# Patient Record
Sex: Female | Born: 1975 | Race: White | Hispanic: No | Marital: Married
Health system: Southern US, Community
[De-identification: ages and names within clinical notes are randomized; demographics above are authoritative.]

---

## 2001-12-05 ENCOUNTER — Emergency Department (HOSPITAL_COMMUNITY): Admission: EM | Admit: 2001-12-05 | Discharge: 2001-12-05 | Payer: Self-pay | Admitting: *Deleted

## 2001-12-05 ENCOUNTER — Encounter: Payer: Self-pay | Admitting: Emergency Medicine

## 2002-06-07 ENCOUNTER — Encounter: Admission: RE | Admit: 2002-06-07 | Discharge: 2002-06-07 | Payer: Self-pay | Admitting: Internal Medicine

## 2002-06-07 ENCOUNTER — Encounter: Payer: Self-pay | Admitting: Internal Medicine

## 2002-06-07 ENCOUNTER — Encounter: Admission: RE | Admit: 2002-06-07 | Discharge: 2002-09-05 | Payer: Self-pay | Admitting: Internal Medicine

## 2005-05-13 ENCOUNTER — Encounter: Admission: RE | Admit: 2005-05-13 | Discharge: 2005-05-13 | Payer: Self-pay | Admitting: Internal Medicine

## 2005-06-10 ENCOUNTER — Ambulatory Visit (HOSPITAL_COMMUNITY): Admission: RE | Admit: 2005-06-10 | Discharge: 2005-06-10 | Payer: Self-pay | Admitting: Internal Medicine

## 2011-01-26 ENCOUNTER — Other Ambulatory Visit: Payer: Self-pay | Admitting: Internal Medicine

## 2011-01-26 DIAGNOSIS — E041 Nontoxic single thyroid nodule: Secondary | ICD-10-CM

## 2011-02-09 ENCOUNTER — Other Ambulatory Visit (HOSPITAL_COMMUNITY)
Admission: RE | Admit: 2011-02-09 | Discharge: 2011-02-09 | Disposition: A | Discharge status: Home / Self Care | Payer: BC Managed Care – PPO | Source: Ambulatory Visit | Attending: Interventional Radiology | Admitting: Interventional Radiology

## 2011-02-09 ENCOUNTER — Ambulatory Visit
Admission: RE | Admit: 2011-02-09 | Discharge: 2011-02-09 | Disposition: A | Discharge status: Home / Self Care | Payer: BC Managed Care – PPO | Source: Ambulatory Visit | Attending: Internal Medicine | Admitting: Internal Medicine

## 2011-02-09 DIAGNOSIS — E041 Nontoxic single thyroid nodule: Secondary | ICD-10-CM

## 2011-02-09 DIAGNOSIS — E049 Nontoxic goiter, unspecified: Secondary | ICD-10-CM | POA: Insufficient documentation

## 2014-05-06 ENCOUNTER — Other Ambulatory Visit (HOSPITAL_BASED_OUTPATIENT_CLINIC_OR_DEPARTMENT_OTHER): Payer: Self-pay | Admitting: Obstetrics and Gynecology

## 2014-05-06 DIAGNOSIS — Z1231 Encounter for screening mammogram for malignant neoplasm of breast: Secondary | ICD-10-CM

## 2014-05-07 ENCOUNTER — Ambulatory Visit (HOSPITAL_BASED_OUTPATIENT_CLINIC_OR_DEPARTMENT_OTHER)
Admission: RE | Admit: 2014-05-07 | Discharge: 2014-05-07 | Disposition: A | Discharge status: Home / Self Care | Payer: 59 | Source: Ambulatory Visit | Attending: Obstetrics and Gynecology | Admitting: Obstetrics and Gynecology

## 2014-05-07 DIAGNOSIS — Z1231 Encounter for screening mammogram for malignant neoplasm of breast: Secondary | ICD-10-CM | POA: Insufficient documentation

## 2014-05-09 ENCOUNTER — Other Ambulatory Visit: Payer: Self-pay | Admitting: Obstetrics and Gynecology

## 2014-05-09 DIAGNOSIS — R928 Other abnormal and inconclusive findings on diagnostic imaging of breast: Secondary | ICD-10-CM

## 2014-05-20 ENCOUNTER — Ambulatory Visit
Admission: RE | Admit: 2014-05-20 | Discharge: 2014-05-20 | Disposition: A | Discharge status: Home / Self Care | Payer: 59 | Source: Ambulatory Visit | Attending: Obstetrics and Gynecology | Admitting: Obstetrics and Gynecology

## 2014-05-20 DIAGNOSIS — R928 Other abnormal and inconclusive findings on diagnostic imaging of breast: Secondary | ICD-10-CM

## 2014-10-14 ENCOUNTER — Other Ambulatory Visit: Payer: Self-pay | Admitting: Obstetrics and Gynecology

## 2014-10-14 ENCOUNTER — Other Ambulatory Visit: Payer: Self-pay | Admitting: Internal Medicine

## 2014-10-14 DIAGNOSIS — R921 Mammographic calcification found on diagnostic imaging of breast: Secondary | ICD-10-CM

## 2014-10-14 DIAGNOSIS — N63 Unspecified lump in unspecified breast: Secondary | ICD-10-CM

## 2014-11-04 ENCOUNTER — Ambulatory Visit
Admission: RE | Admit: 2014-11-04 | Discharge: 2014-11-04 | Disposition: A | Discharge status: Home / Self Care | Payer: 59 | Source: Ambulatory Visit | Attending: Internal Medicine | Admitting: Internal Medicine

## 2014-11-04 DIAGNOSIS — R921 Mammographic calcification found on diagnostic imaging of breast: Secondary | ICD-10-CM

## 2015-05-30 ENCOUNTER — Other Ambulatory Visit: Payer: Self-pay | Admitting: Internal Medicine

## 2015-05-30 DIAGNOSIS — R921 Mammographic calcification found on diagnostic imaging of breast: Secondary | ICD-10-CM

## 2015-06-12 ENCOUNTER — Ambulatory Visit
Admission: RE | Admit: 2015-06-12 | Discharge: 2015-06-12 | Disposition: A | Discharge status: Home / Self Care | Payer: 59 | Source: Ambulatory Visit | Attending: Internal Medicine | Admitting: Internal Medicine

## 2015-06-12 DIAGNOSIS — R921 Mammographic calcification found on diagnostic imaging of breast: Secondary | ICD-10-CM

## 2016-08-05 ENCOUNTER — Other Ambulatory Visit: Payer: Self-pay | Admitting: Internal Medicine

## 2016-08-05 DIAGNOSIS — R921 Mammographic calcification found on diagnostic imaging of breast: Secondary | ICD-10-CM

## 2016-08-16 ENCOUNTER — Ambulatory Visit
Admission: RE | Admit: 2016-08-16 | Discharge: 2016-08-16 | Disposition: A | Discharge status: Home / Self Care | Payer: 59 | Source: Ambulatory Visit | Attending: Internal Medicine | Admitting: Internal Medicine

## 2016-08-16 DIAGNOSIS — R921 Mammographic calcification found on diagnostic imaging of breast: Secondary | ICD-10-CM

## 2016-10-04 DIAGNOSIS — E10319 Type 1 diabetes mellitus with unspecified diabetic retinopathy without macular edema: Secondary | ICD-10-CM | POA: Diagnosis not present

## 2016-10-04 DIAGNOSIS — E784 Other hyperlipidemia: Secondary | ICD-10-CM | POA: Diagnosis not present

## 2016-10-04 DIAGNOSIS — Z794 Long term (current) use of insulin: Secondary | ICD-10-CM | POA: Diagnosis not present

## 2016-12-05 DIAGNOSIS — E1065 Type 1 diabetes mellitus with hyperglycemia: Secondary | ICD-10-CM | POA: Diagnosis not present

## 2017-01-31 DIAGNOSIS — E041 Nontoxic single thyroid nodule: Secondary | ICD-10-CM | POA: Diagnosis not present

## 2017-01-31 DIAGNOSIS — Z0000 Encounter for general adult medical examination without abnormal findings: Secondary | ICD-10-CM | POA: Diagnosis not present

## 2017-03-02 DIAGNOSIS — E083552 Diabetes mellitus due to underlying condition with stable proliferative diabetic retinopathy, left eye: Secondary | ICD-10-CM | POA: Diagnosis not present

## 2017-03-02 DIAGNOSIS — E113291 Type 2 diabetes mellitus with mild nonproliferative diabetic retinopathy without macular edema, right eye: Secondary | ICD-10-CM | POA: Diagnosis not present

## 2017-03-02 DIAGNOSIS — H30893 Other chorioretinal inflammations, bilateral: Secondary | ICD-10-CM | POA: Diagnosis not present

## 2017-03-02 DIAGNOSIS — E113299 Type 2 diabetes mellitus with mild nonproliferative diabetic retinopathy without macular edema, unspecified eye: Secondary | ICD-10-CM | POA: Diagnosis not present

## 2017-03-02 DIAGNOSIS — E113552 Type 2 diabetes mellitus with stable proliferative diabetic retinopathy, left eye: Secondary | ICD-10-CM | POA: Diagnosis not present

## 2017-03-15 DIAGNOSIS — Z23 Encounter for immunization: Secondary | ICD-10-CM | POA: Diagnosis not present

## 2017-03-15 DIAGNOSIS — E10319 Type 1 diabetes mellitus with unspecified diabetic retinopathy without macular edema: Secondary | ICD-10-CM | POA: Diagnosis not present

## 2017-03-15 DIAGNOSIS — E784 Other hyperlipidemia: Secondary | ICD-10-CM | POA: Diagnosis not present

## 2017-03-15 DIAGNOSIS — Z794 Long term (current) use of insulin: Secondary | ICD-10-CM | POA: Diagnosis not present

## 2017-03-15 DIAGNOSIS — Z0000 Encounter for general adult medical examination without abnormal findings: Secondary | ICD-10-CM | POA: Diagnosis not present

## 2017-05-03 DIAGNOSIS — Z7189 Other specified counseling: Secondary | ICD-10-CM | POA: Diagnosis not present

## 2017-06-09 DIAGNOSIS — G479 Sleep disorder, unspecified: Secondary | ICD-10-CM | POA: Diagnosis not present

## 2017-06-21 DIAGNOSIS — E1065 Type 1 diabetes mellitus with hyperglycemia: Secondary | ICD-10-CM | POA: Diagnosis not present

## 2017-07-07 DIAGNOSIS — E10319 Type 1 diabetes mellitus with unspecified diabetic retinopathy without macular edema: Secondary | ICD-10-CM | POA: Diagnosis not present

## 2017-07-07 DIAGNOSIS — Z794 Long term (current) use of insulin: Secondary | ICD-10-CM | POA: Diagnosis not present

## 2017-08-02 DIAGNOSIS — L700 Acne vulgaris: Secondary | ICD-10-CM | POA: Diagnosis not present

## 2017-08-02 DIAGNOSIS — D225 Melanocytic nevi of trunk: Secondary | ICD-10-CM | POA: Diagnosis not present

## 2017-08-02 DIAGNOSIS — D2261 Melanocytic nevi of right upper limb, including shoulder: Secondary | ICD-10-CM | POA: Diagnosis not present

## 2017-08-04 DIAGNOSIS — E1065 Type 1 diabetes mellitus with hyperglycemia: Secondary | ICD-10-CM | POA: Diagnosis not present

## 2017-09-02 DIAGNOSIS — Z4681 Encounter for fitting and adjustment of insulin pump: Secondary | ICD-10-CM | POA: Diagnosis not present

## 2017-09-02 DIAGNOSIS — E10319 Type 1 diabetes mellitus with unspecified diabetic retinopathy without macular edema: Secondary | ICD-10-CM | POA: Diagnosis not present

## 2017-09-02 DIAGNOSIS — Z794 Long term (current) use of insulin: Secondary | ICD-10-CM | POA: Diagnosis not present

## 2017-10-11 DIAGNOSIS — Z794 Long term (current) use of insulin: Secondary | ICD-10-CM | POA: Diagnosis not present

## 2017-10-11 DIAGNOSIS — E10319 Type 1 diabetes mellitus with unspecified diabetic retinopathy without macular edema: Secondary | ICD-10-CM | POA: Diagnosis not present

## 2017-10-11 DIAGNOSIS — Z4681 Encounter for fitting and adjustment of insulin pump: Secondary | ICD-10-CM | POA: Diagnosis not present

## 2017-12-28 DIAGNOSIS — E1065 Type 1 diabetes mellitus with hyperglycemia: Secondary | ICD-10-CM | POA: Diagnosis not present

## 2018-01-11 DIAGNOSIS — E113299 Type 2 diabetes mellitus with mild nonproliferative diabetic retinopathy without macular edema, unspecified eye: Secondary | ICD-10-CM | POA: Diagnosis not present

## 2018-01-11 DIAGNOSIS — E083592 Diabetes mellitus due to underlying condition with proliferative diabetic retinopathy without macular edema, left eye: Secondary | ICD-10-CM | POA: Diagnosis not present

## 2018-01-11 DIAGNOSIS — H5213 Myopia, bilateral: Secondary | ICD-10-CM | POA: Diagnosis not present

## 2018-01-11 DIAGNOSIS — H30893 Other chorioretinal inflammations, bilateral: Secondary | ICD-10-CM | POA: Diagnosis not present

## 2018-01-11 DIAGNOSIS — E113592 Type 2 diabetes mellitus with proliferative diabetic retinopathy without macular edema, left eye: Secondary | ICD-10-CM | POA: Diagnosis not present

## 2018-03-15 DIAGNOSIS — R82998 Other abnormal findings in urine: Secondary | ICD-10-CM | POA: Diagnosis not present

## 2018-03-15 DIAGNOSIS — E10319 Type 1 diabetes mellitus with unspecified diabetic retinopathy without macular edema: Secondary | ICD-10-CM | POA: Diagnosis not present

## 2018-03-15 DIAGNOSIS — Z0000 Encounter for general adult medical examination without abnormal findings: Secondary | ICD-10-CM | POA: Diagnosis not present

## 2018-03-21 DIAGNOSIS — M7502 Adhesive capsulitis of left shoulder: Secondary | ICD-10-CM | POA: Diagnosis not present

## 2018-03-21 DIAGNOSIS — Z23 Encounter for immunization: Secondary | ICD-10-CM | POA: Diagnosis not present

## 2018-03-21 DIAGNOSIS — Z1389 Encounter for screening for other disorder: Secondary | ICD-10-CM | POA: Diagnosis not present

## 2018-03-21 DIAGNOSIS — Z0000 Encounter for general adult medical examination without abnormal findings: Secondary | ICD-10-CM | POA: Diagnosis not present

## 2018-03-29 DIAGNOSIS — M25512 Pain in left shoulder: Secondary | ICD-10-CM | POA: Diagnosis not present

## 2018-03-29 DIAGNOSIS — M7502 Adhesive capsulitis of left shoulder: Secondary | ICD-10-CM | POA: Diagnosis not present

## 2018-03-29 DIAGNOSIS — M25612 Stiffness of left shoulder, not elsewhere classified: Secondary | ICD-10-CM | POA: Diagnosis not present

## 2018-03-30 DIAGNOSIS — M7502 Adhesive capsulitis of left shoulder: Secondary | ICD-10-CM | POA: Diagnosis not present

## 2018-03-30 DIAGNOSIS — M25512 Pain in left shoulder: Secondary | ICD-10-CM | POA: Diagnosis not present

## 2018-03-30 DIAGNOSIS — M25612 Stiffness of left shoulder, not elsewhere classified: Secondary | ICD-10-CM | POA: Diagnosis not present

## 2018-04-06 DIAGNOSIS — M25612 Stiffness of left shoulder, not elsewhere classified: Secondary | ICD-10-CM | POA: Diagnosis not present

## 2018-04-06 DIAGNOSIS — M7502 Adhesive capsulitis of left shoulder: Secondary | ICD-10-CM | POA: Diagnosis not present

## 2018-04-06 DIAGNOSIS — M25512 Pain in left shoulder: Secondary | ICD-10-CM | POA: Diagnosis not present

## 2018-04-07 DIAGNOSIS — M7502 Adhesive capsulitis of left shoulder: Secondary | ICD-10-CM | POA: Diagnosis not present

## 2018-04-07 DIAGNOSIS — M25512 Pain in left shoulder: Secondary | ICD-10-CM | POA: Diagnosis not present

## 2018-04-07 DIAGNOSIS — M25612 Stiffness of left shoulder, not elsewhere classified: Secondary | ICD-10-CM | POA: Diagnosis not present

## 2018-04-11 DIAGNOSIS — M7502 Adhesive capsulitis of left shoulder: Secondary | ICD-10-CM | POA: Diagnosis not present

## 2018-04-11 DIAGNOSIS — M25612 Stiffness of left shoulder, not elsewhere classified: Secondary | ICD-10-CM | POA: Diagnosis not present

## 2018-04-11 DIAGNOSIS — M25512 Pain in left shoulder: Secondary | ICD-10-CM | POA: Diagnosis not present

## 2018-04-13 DIAGNOSIS — M7502 Adhesive capsulitis of left shoulder: Secondary | ICD-10-CM | POA: Diagnosis not present

## 2018-04-13 DIAGNOSIS — M25512 Pain in left shoulder: Secondary | ICD-10-CM | POA: Diagnosis not present

## 2018-04-13 DIAGNOSIS — M25612 Stiffness of left shoulder, not elsewhere classified: Secondary | ICD-10-CM | POA: Diagnosis not present

## 2018-04-14 DIAGNOSIS — M7502 Adhesive capsulitis of left shoulder: Secondary | ICD-10-CM | POA: Diagnosis not present

## 2018-04-14 DIAGNOSIS — M25512 Pain in left shoulder: Secondary | ICD-10-CM | POA: Diagnosis not present

## 2018-04-14 DIAGNOSIS — M25612 Stiffness of left shoulder, not elsewhere classified: Secondary | ICD-10-CM | POA: Diagnosis not present

## 2018-04-18 DIAGNOSIS — M25512 Pain in left shoulder: Secondary | ICD-10-CM | POA: Diagnosis not present

## 2018-04-18 DIAGNOSIS — M7502 Adhesive capsulitis of left shoulder: Secondary | ICD-10-CM | POA: Diagnosis not present

## 2018-04-18 DIAGNOSIS — M25612 Stiffness of left shoulder, not elsewhere classified: Secondary | ICD-10-CM | POA: Diagnosis not present

## 2018-04-20 DIAGNOSIS — M25612 Stiffness of left shoulder, not elsewhere classified: Secondary | ICD-10-CM | POA: Diagnosis not present

## 2018-04-20 DIAGNOSIS — M7502 Adhesive capsulitis of left shoulder: Secondary | ICD-10-CM | POA: Diagnosis not present

## 2018-04-20 DIAGNOSIS — M25512 Pain in left shoulder: Secondary | ICD-10-CM | POA: Diagnosis not present

## 2018-04-22 DIAGNOSIS — M7502 Adhesive capsulitis of left shoulder: Secondary | ICD-10-CM | POA: Diagnosis not present

## 2018-04-22 DIAGNOSIS — M25512 Pain in left shoulder: Secondary | ICD-10-CM | POA: Diagnosis not present

## 2018-04-22 DIAGNOSIS — M25612 Stiffness of left shoulder, not elsewhere classified: Secondary | ICD-10-CM | POA: Diagnosis not present

## 2018-04-25 DIAGNOSIS — M25512 Pain in left shoulder: Secondary | ICD-10-CM | POA: Diagnosis not present

## 2018-04-25 DIAGNOSIS — M7502 Adhesive capsulitis of left shoulder: Secondary | ICD-10-CM | POA: Diagnosis not present

## 2018-04-25 DIAGNOSIS — M25612 Stiffness of left shoulder, not elsewhere classified: Secondary | ICD-10-CM | POA: Diagnosis not present

## 2018-04-27 DIAGNOSIS — M25512 Pain in left shoulder: Secondary | ICD-10-CM | POA: Diagnosis not present

## 2018-04-27 DIAGNOSIS — M25612 Stiffness of left shoulder, not elsewhere classified: Secondary | ICD-10-CM | POA: Diagnosis not present

## 2018-04-27 DIAGNOSIS — M7502 Adhesive capsulitis of left shoulder: Secondary | ICD-10-CM | POA: Diagnosis not present

## 2018-05-04 DIAGNOSIS — M25512 Pain in left shoulder: Secondary | ICD-10-CM | POA: Diagnosis not present

## 2018-05-04 DIAGNOSIS — M7502 Adhesive capsulitis of left shoulder: Secondary | ICD-10-CM | POA: Diagnosis not present

## 2018-05-04 DIAGNOSIS — M25612 Stiffness of left shoulder, not elsewhere classified: Secondary | ICD-10-CM | POA: Diagnosis not present

## 2018-05-05 DIAGNOSIS — M25612 Stiffness of left shoulder, not elsewhere classified: Secondary | ICD-10-CM | POA: Diagnosis not present

## 2018-05-05 DIAGNOSIS — M7502 Adhesive capsulitis of left shoulder: Secondary | ICD-10-CM | POA: Diagnosis not present

## 2018-05-05 DIAGNOSIS — M25512 Pain in left shoulder: Secondary | ICD-10-CM | POA: Diagnosis not present

## 2018-05-10 DIAGNOSIS — M25612 Stiffness of left shoulder, not elsewhere classified: Secondary | ICD-10-CM | POA: Diagnosis not present

## 2018-05-10 DIAGNOSIS — M25512 Pain in left shoulder: Secondary | ICD-10-CM | POA: Diagnosis not present

## 2018-05-10 DIAGNOSIS — M7502 Adhesive capsulitis of left shoulder: Secondary | ICD-10-CM | POA: Diagnosis not present

## 2018-05-11 DIAGNOSIS — M25512 Pain in left shoulder: Secondary | ICD-10-CM | POA: Diagnosis not present

## 2018-05-11 DIAGNOSIS — M25612 Stiffness of left shoulder, not elsewhere classified: Secondary | ICD-10-CM | POA: Diagnosis not present

## 2018-05-11 DIAGNOSIS — M7502 Adhesive capsulitis of left shoulder: Secondary | ICD-10-CM | POA: Diagnosis not present

## 2018-05-12 DIAGNOSIS — M7502 Adhesive capsulitis of left shoulder: Secondary | ICD-10-CM | POA: Diagnosis not present

## 2018-05-12 DIAGNOSIS — M25612 Stiffness of left shoulder, not elsewhere classified: Secondary | ICD-10-CM | POA: Diagnosis not present

## 2018-05-12 DIAGNOSIS — M25512 Pain in left shoulder: Secondary | ICD-10-CM | POA: Diagnosis not present

## 2018-07-03 DIAGNOSIS — E1065 Type 1 diabetes mellitus with hyperglycemia: Secondary | ICD-10-CM | POA: Diagnosis not present

## 2018-07-28 ENCOUNTER — Other Ambulatory Visit: Payer: Self-pay | Admitting: Internal Medicine

## 2018-07-28 DIAGNOSIS — Z1231 Encounter for screening mammogram for malignant neoplasm of breast: Secondary | ICD-10-CM

## 2018-07-31 ENCOUNTER — Ambulatory Visit
Admission: RE | Admit: 2018-07-31 | Discharge: 2018-07-31 | Disposition: A | Discharge status: Home / Self Care | Payer: 59 | Source: Ambulatory Visit | Attending: Internal Medicine | Admitting: Internal Medicine

## 2018-07-31 DIAGNOSIS — Z1231 Encounter for screening mammogram for malignant neoplasm of breast: Secondary | ICD-10-CM | POA: Diagnosis not present

## 2018-09-05 DIAGNOSIS — D2261 Melanocytic nevi of right upper limb, including shoulder: Secondary | ICD-10-CM | POA: Diagnosis not present

## 2018-09-05 DIAGNOSIS — L814 Other melanin hyperpigmentation: Secondary | ICD-10-CM | POA: Diagnosis not present

## 2018-09-05 DIAGNOSIS — L700 Acne vulgaris: Secondary | ICD-10-CM | POA: Diagnosis not present

## 2018-11-14 DIAGNOSIS — E10319 Type 1 diabetes mellitus with unspecified diabetic retinopathy without macular edema: Secondary | ICD-10-CM | POA: Diagnosis not present

## 2018-11-14 DIAGNOSIS — M7502 Adhesive capsulitis of left shoulder: Secondary | ICD-10-CM | POA: Diagnosis not present

## 2018-11-14 DIAGNOSIS — Z794 Long term (current) use of insulin: Secondary | ICD-10-CM | POA: Diagnosis not present

## 2018-11-21 DIAGNOSIS — E10319 Type 1 diabetes mellitus with unspecified diabetic retinopathy without macular edema: Secondary | ICD-10-CM | POA: Diagnosis not present

## 2018-12-08 DIAGNOSIS — E1065 Type 1 diabetes mellitus with hyperglycemia: Secondary | ICD-10-CM | POA: Diagnosis not present

## 2020-03-06 IMAGING — MG DIGITAL SCREENING BILATERAL MAMMOGRAM WITH TOMO AND CAD
7 series · 8 of 19 positions shown · non-contrast
Comparison: Previous exam(s).

CLINICAL DATA: Screening.

EXAM:
DIGITAL SCREENING BILATERAL MAMMOGRAM WITH TOMO AND CAD

[L CC synth-2D]
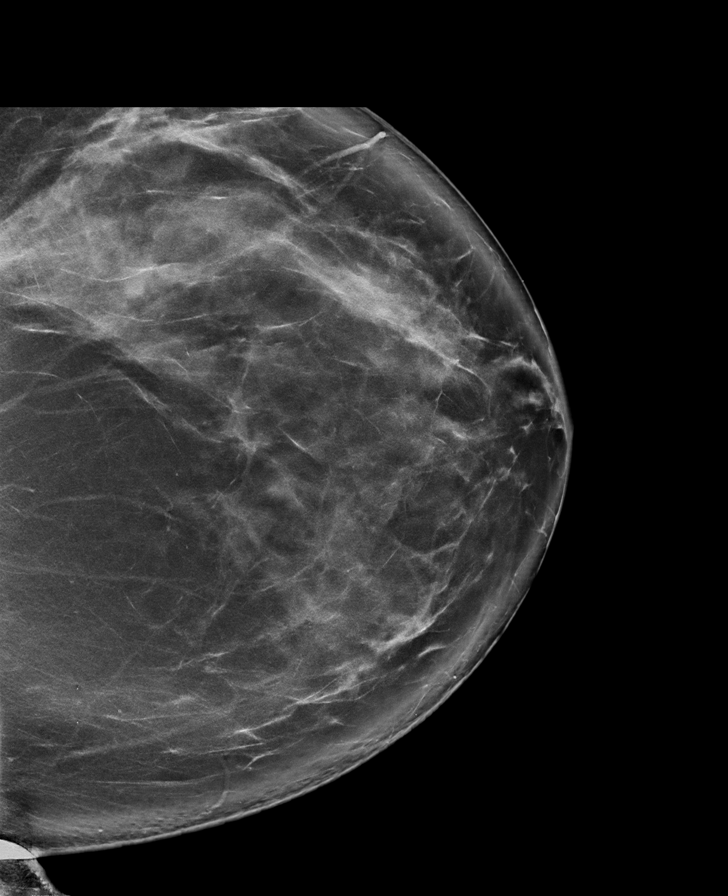

[L MLO synth-2D]
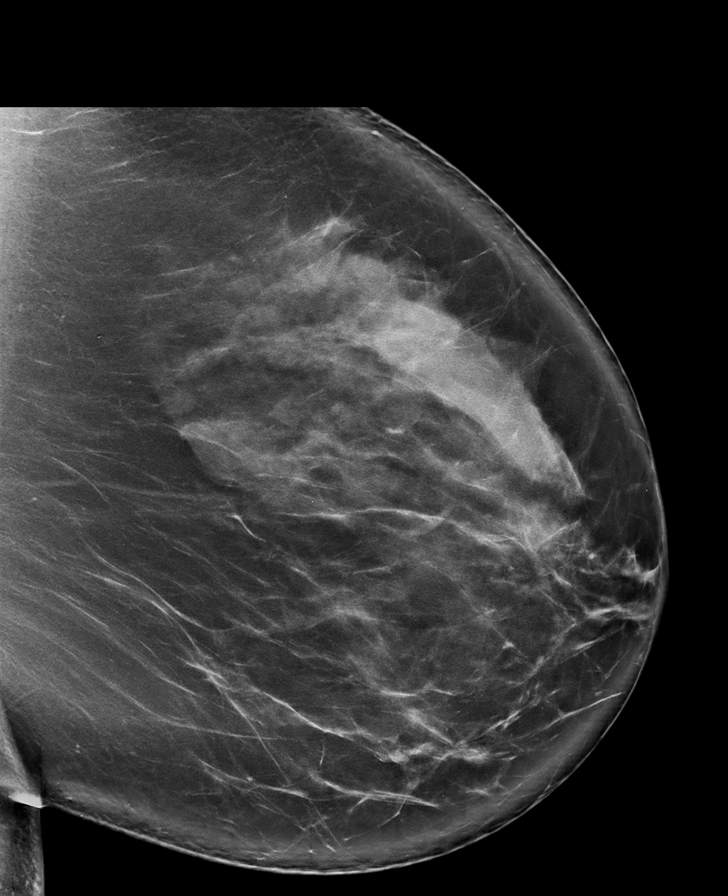

[R MLO synth-2D]
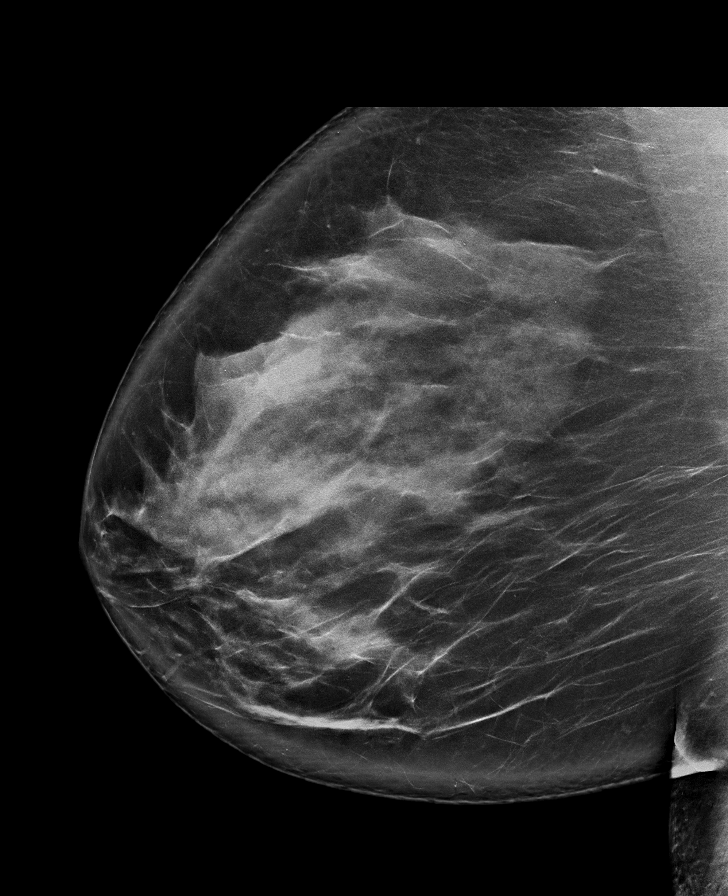

[R CC synth-2D]
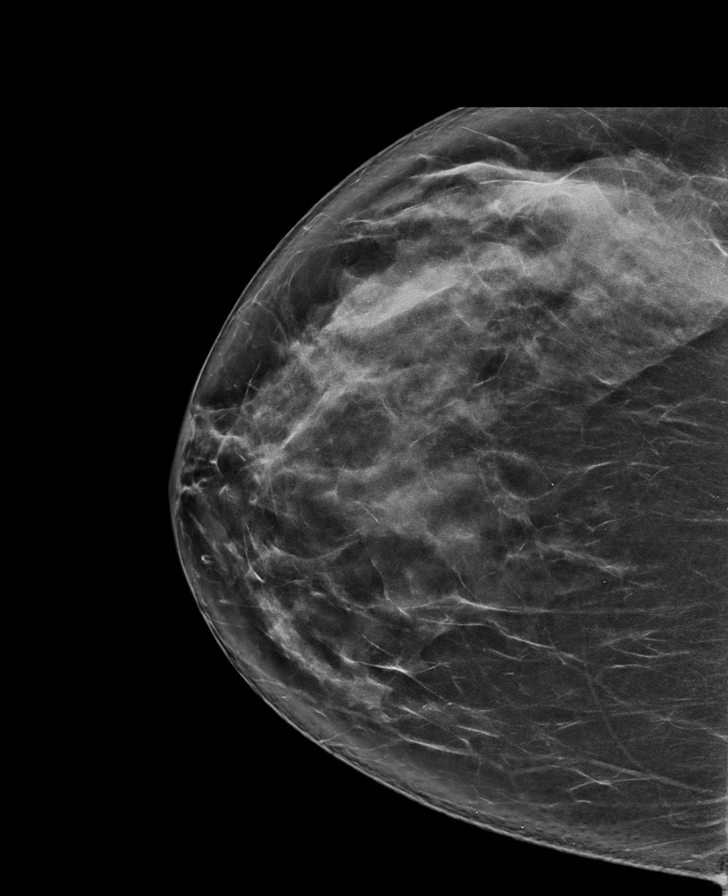

[R MLO tomo · 2 of 111 frames shown]
[frame 36/111]
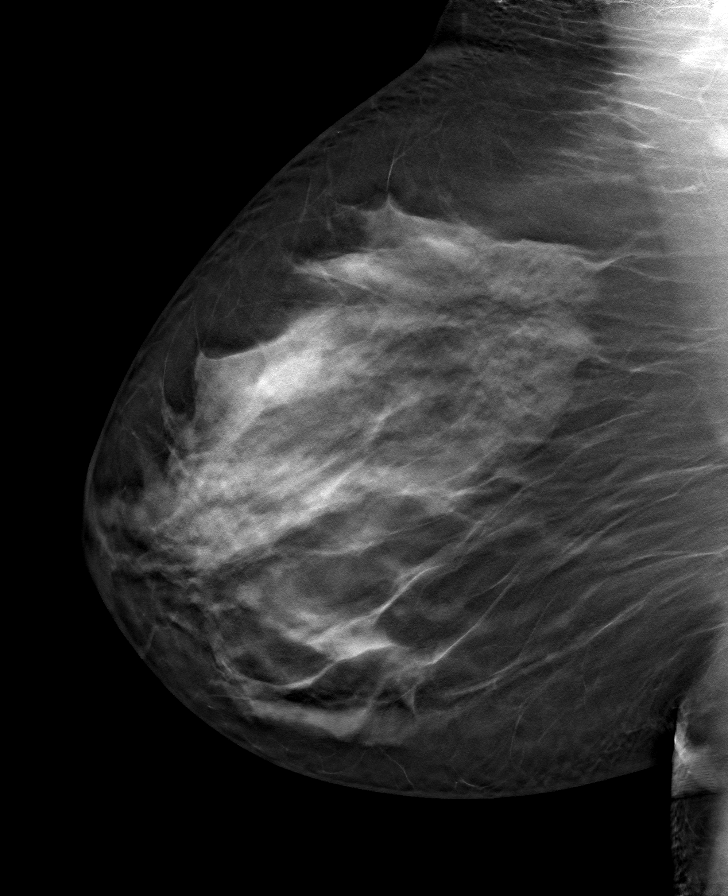
[frame 56/111]
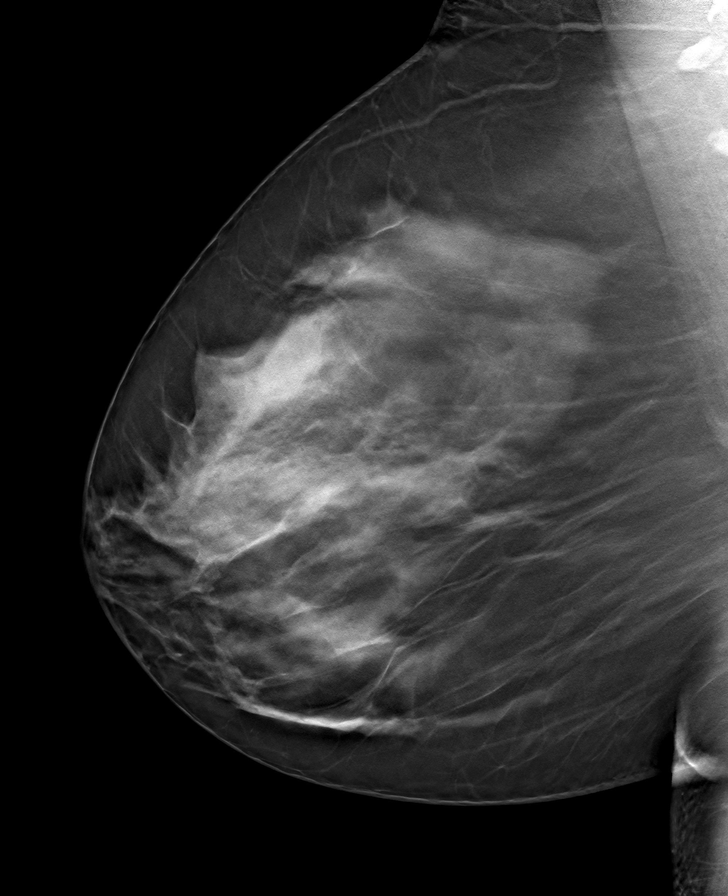

[R CC tomo · tomo slice 49/96.0]
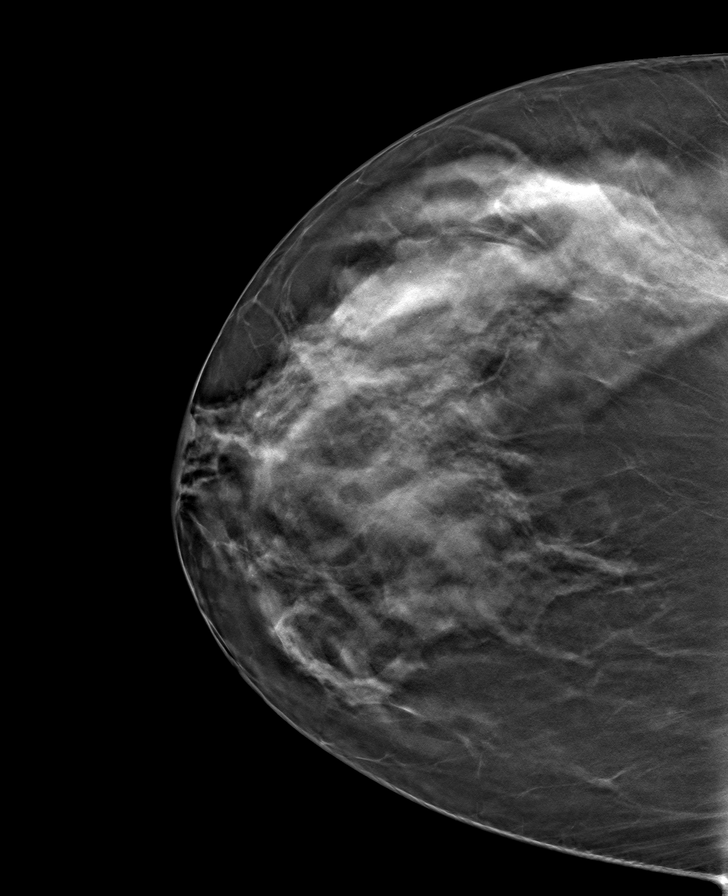

[L CC tomo · tomo slice 57/112.0]
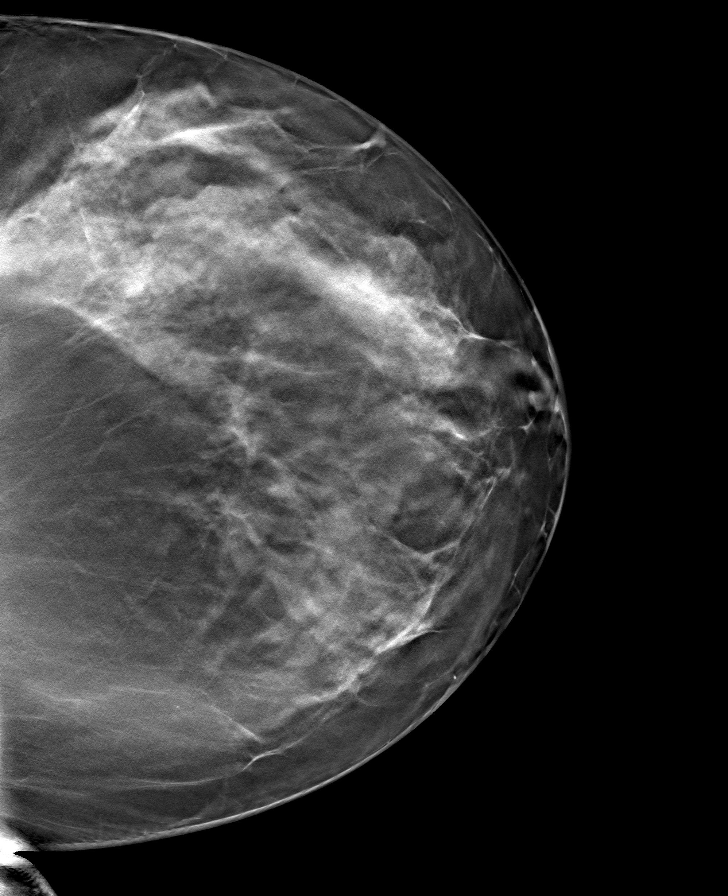

[8 of 19 positions shown; findings below may reference images not displayed]

ACR Breast Density Category c: The breast tissue is heterogeneously
dense, which may obscure small masses.
FINDINGS: There are no findings suspicious for malignancy. Images were
processed with CAD.
IMPRESSION: No mammographic evidence of malignancy. A result letter of this
screening mammogram will be mailed directly to the patient.

RECOMMENDATION:
Screening mammogram in one year. (Code:[id number])

BI-RADS CATEGORY  1: Negative.

## 2022-04-29 ENCOUNTER — Other Ambulatory Visit: Payer: Self-pay | Admitting: Internal Medicine

## 2022-04-29 DIAGNOSIS — Z1231 Encounter for screening mammogram for malignant neoplasm of breast: Secondary | ICD-10-CM

## 2022-06-04 ENCOUNTER — Ambulatory Visit: Payer: 59

## 2022-07-30 ENCOUNTER — Ambulatory Visit
Admission: RE | Admit: 2022-07-30 | Discharge: 2022-07-30 | Disposition: A | Discharge status: Home / Self Care | Payer: Managed Care, Other (non HMO) | Source: Ambulatory Visit | Attending: Internal Medicine | Admitting: Internal Medicine

## 2022-07-30 DIAGNOSIS — Z1231 Encounter for screening mammogram for malignant neoplasm of breast: Secondary | ICD-10-CM

## 2022-08-04 ENCOUNTER — Other Ambulatory Visit: Payer: Self-pay | Admitting: Internal Medicine

## 2022-08-04 DIAGNOSIS — Z0000 Encounter for general adult medical examination without abnormal findings: Secondary | ICD-10-CM

## 2022-09-03 ENCOUNTER — Ambulatory Visit
Admission: RE | Admit: 2022-09-03 | Discharge: 2022-09-03 | Disposition: A | Discharge status: Home / Self Care | Payer: Managed Care, Other (non HMO) | Source: Ambulatory Visit | Attending: Internal Medicine | Admitting: Internal Medicine

## 2022-09-03 DIAGNOSIS — Z0000 Encounter for general adult medical examination without abnormal findings: Secondary | ICD-10-CM

## 2024-05-31 ENCOUNTER — Other Ambulatory Visit: Payer: Self-pay | Admitting: Internal Medicine

## 2024-05-31 DIAGNOSIS — Z1231 Encounter for screening mammogram for malignant neoplasm of breast: Secondary | ICD-10-CM

## 2024-07-02 ENCOUNTER — Ambulatory Visit
Admission: RE | Admit: 2024-07-02 | Discharge: 2024-07-02 | Disposition: A | Source: Ambulatory Visit | Attending: Internal Medicine | Admitting: Internal Medicine

## 2024-07-02 DIAGNOSIS — Z1231 Encounter for screening mammogram for malignant neoplasm of breast: Secondary | ICD-10-CM
